# Patient Record
Sex: Female | Born: 1995 | Race: Black or African American | Hispanic: No | Marital: Single
Health system: Southern US, Community
[De-identification: ages and names within clinical notes are randomized; demographics above are authoritative.]

---
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
## 2016-07-21 IMAGING — CR DG KNEE COMPLETE 4+V*R*
5 series · 5 of 5 positions shown · non-contrast
Comparison: None.

CLINICAL DATA: Right medial knee pain for 2 months.

EXAM:
RIGHT KNEE - COMPLETE 4+ VIEW

[w knee ap right (1 of 3)]
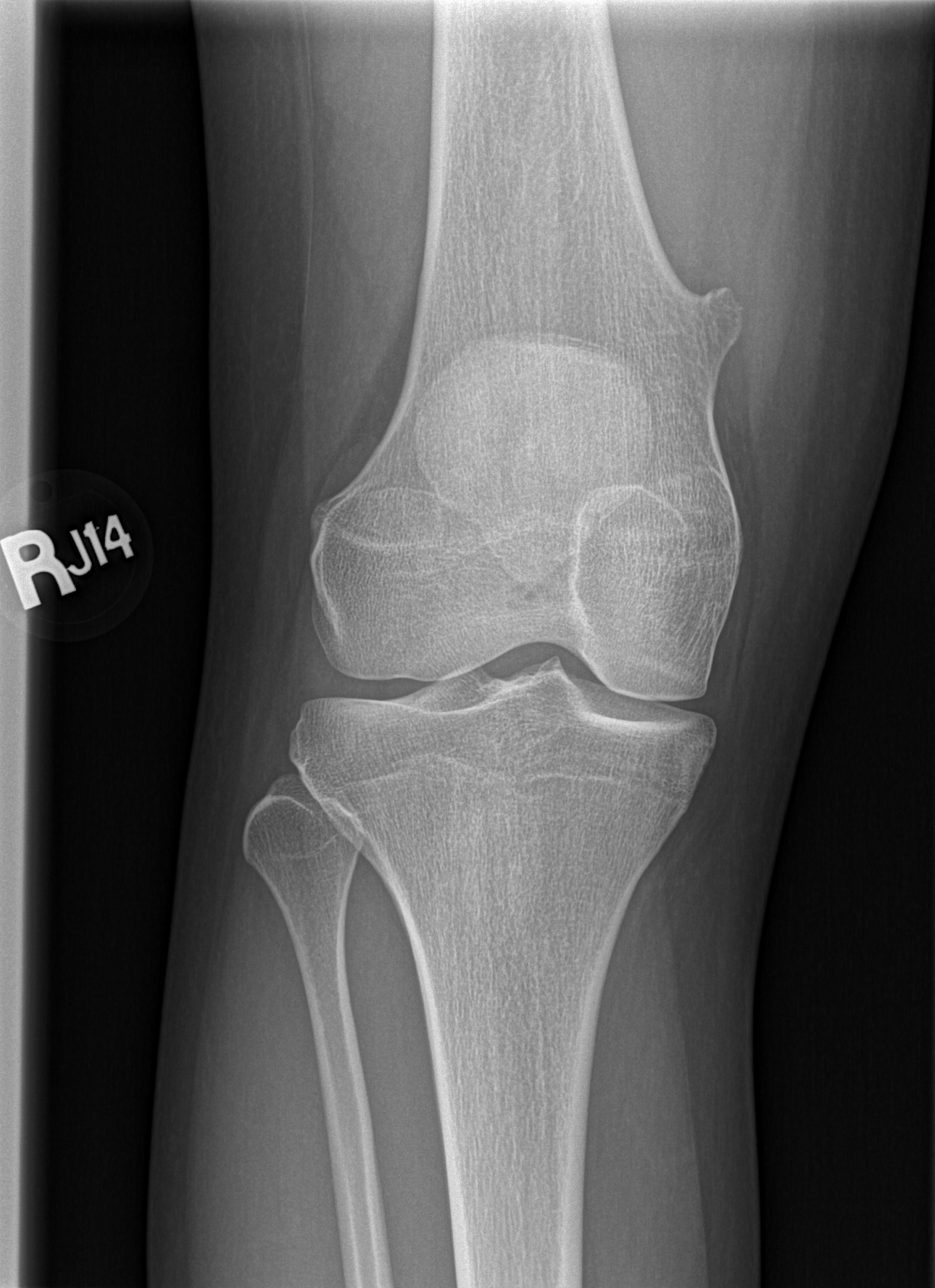

[w knee ap right (2 of 3)]
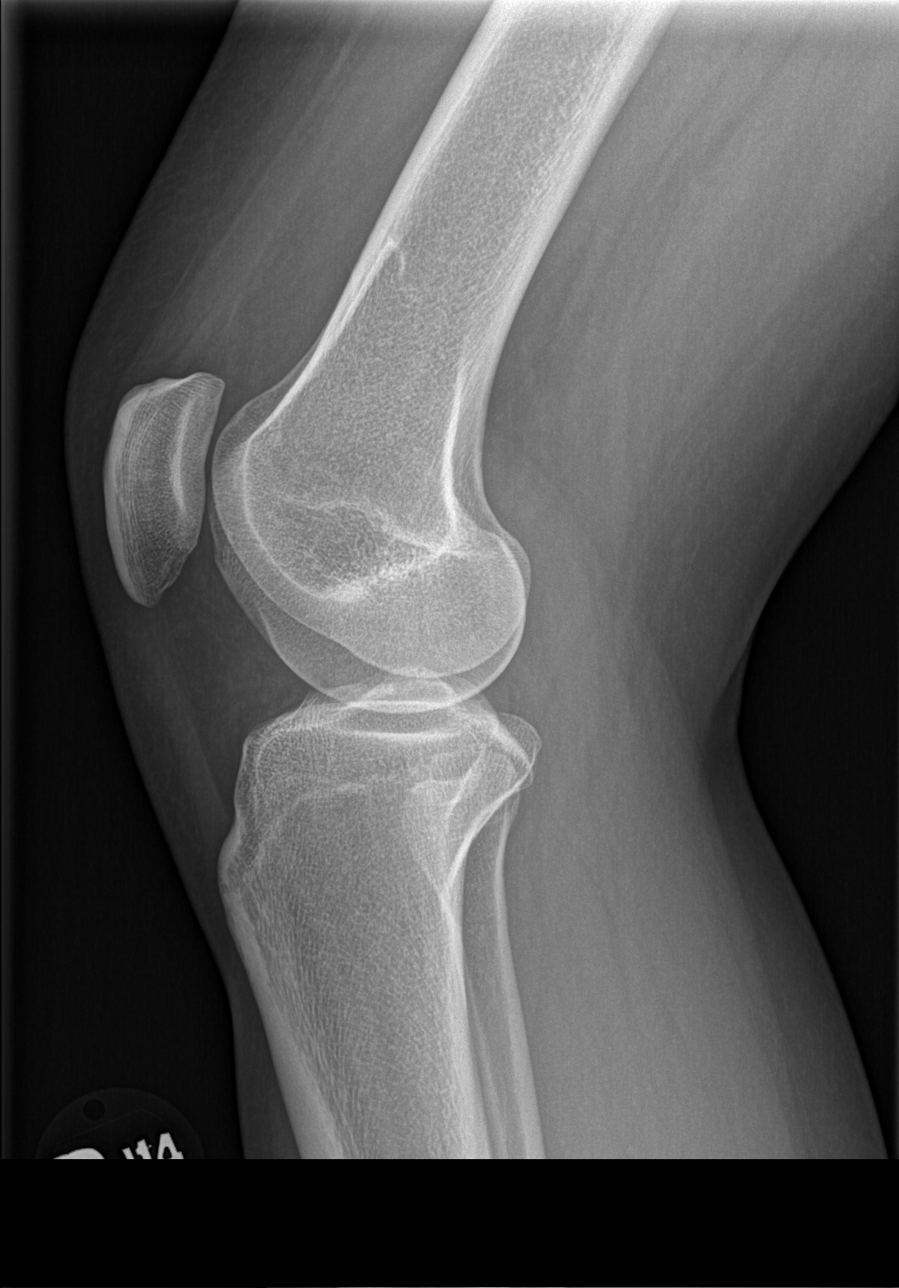

[w knee ap right (3 of 3)]
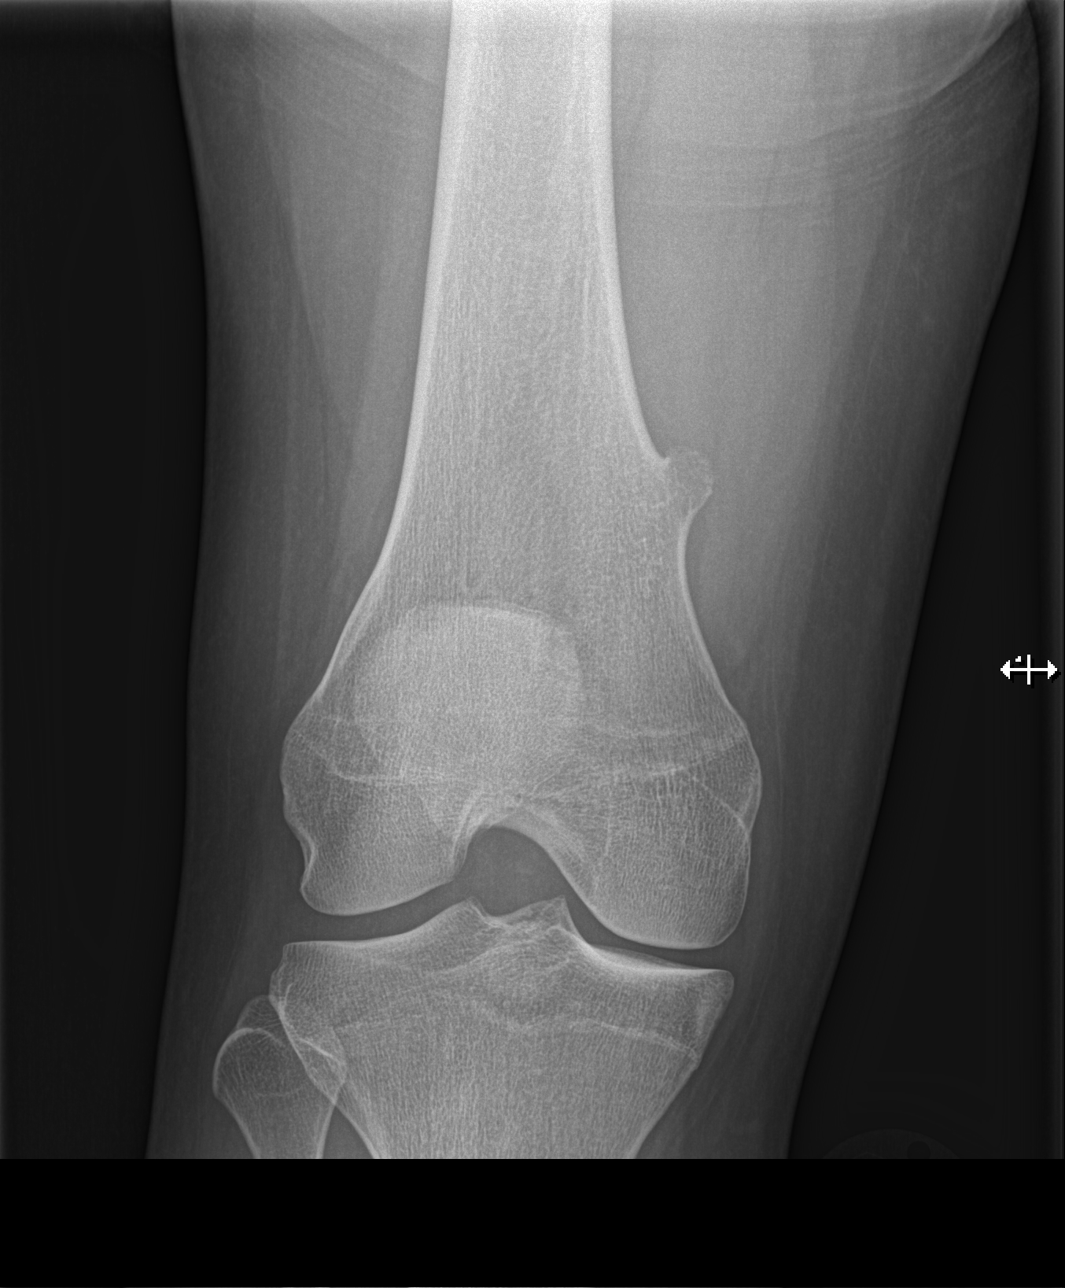

[t knee lat right]
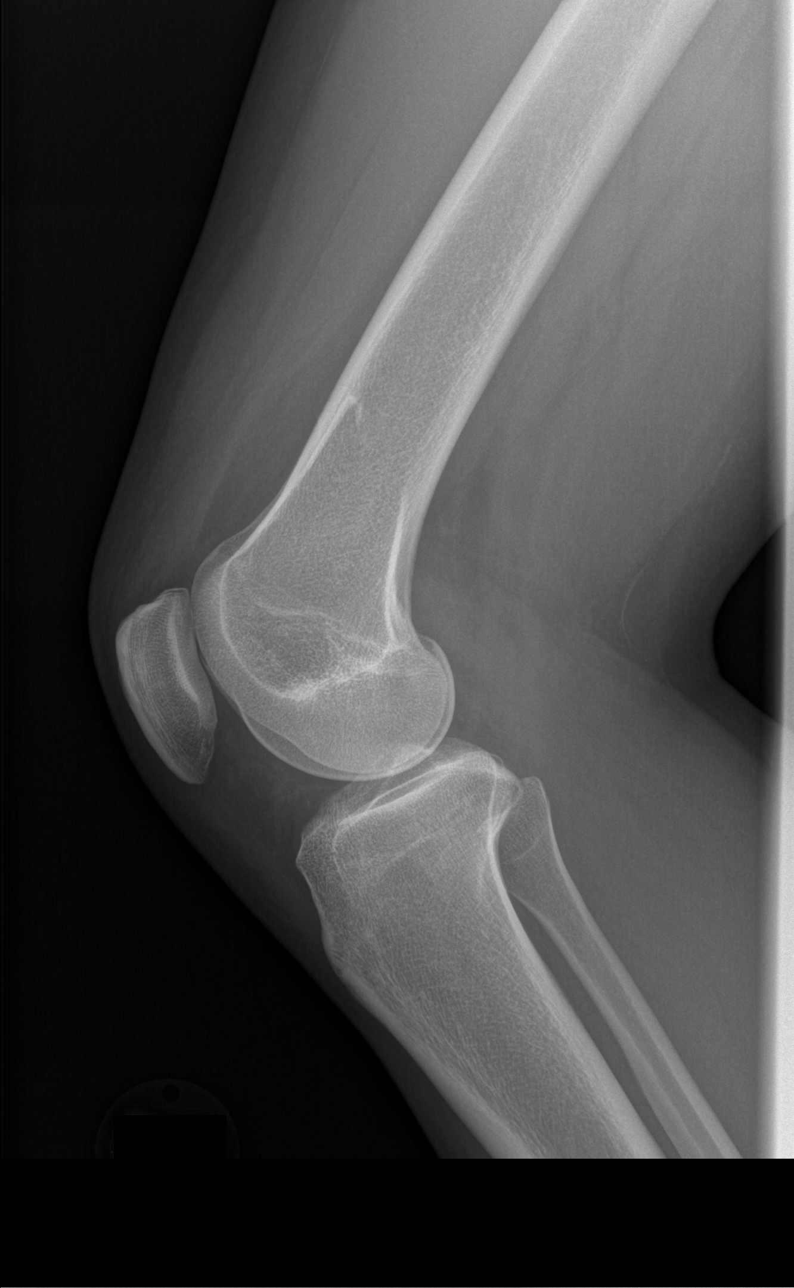

[x knee ap right]
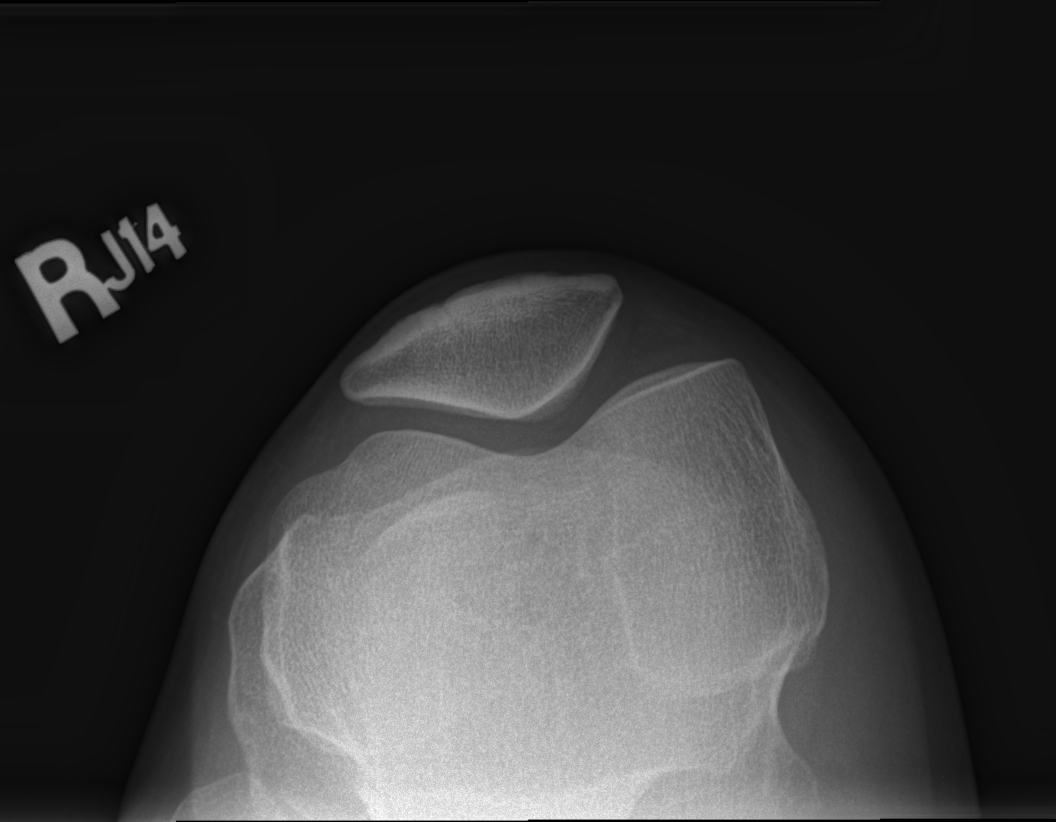

[5 of 5 positions shown; findings below may reference images not displayed]

FINDINGS: Normal alignment and developmental changes without fracture or
effusion. Preserved joint spaces. No significant arthropathy or soft
tissue abnormality. Right medial distal femur exostosis/
osteochondroma noted. This has a benign nonaggressive appearance.
This likely accounts for the palpable abnormality.
IMPRESSION: No acute osseous finding

Right medial distal femur exostosis/osteochondroma
